# Patient Record
Sex: Male | Born: 2000 | Race: Black or African American | Hispanic: No | Marital: Single | Smoking: Never smoker
Health system: Southern US, Community
[De-identification: ages and names within clinical notes are randomized; demographics above are authoritative.]

---
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
## 2019-11-27 IMAGING — CR DG CHEST 2V
2 series · 2 of 2 positions shown · non-contrast
Comparison: None.

CLINICAL DATA: Epigastric pain.  Nausea and vomiting.

EXAM:
CHEST - 2 VIEW

[w chest pa]
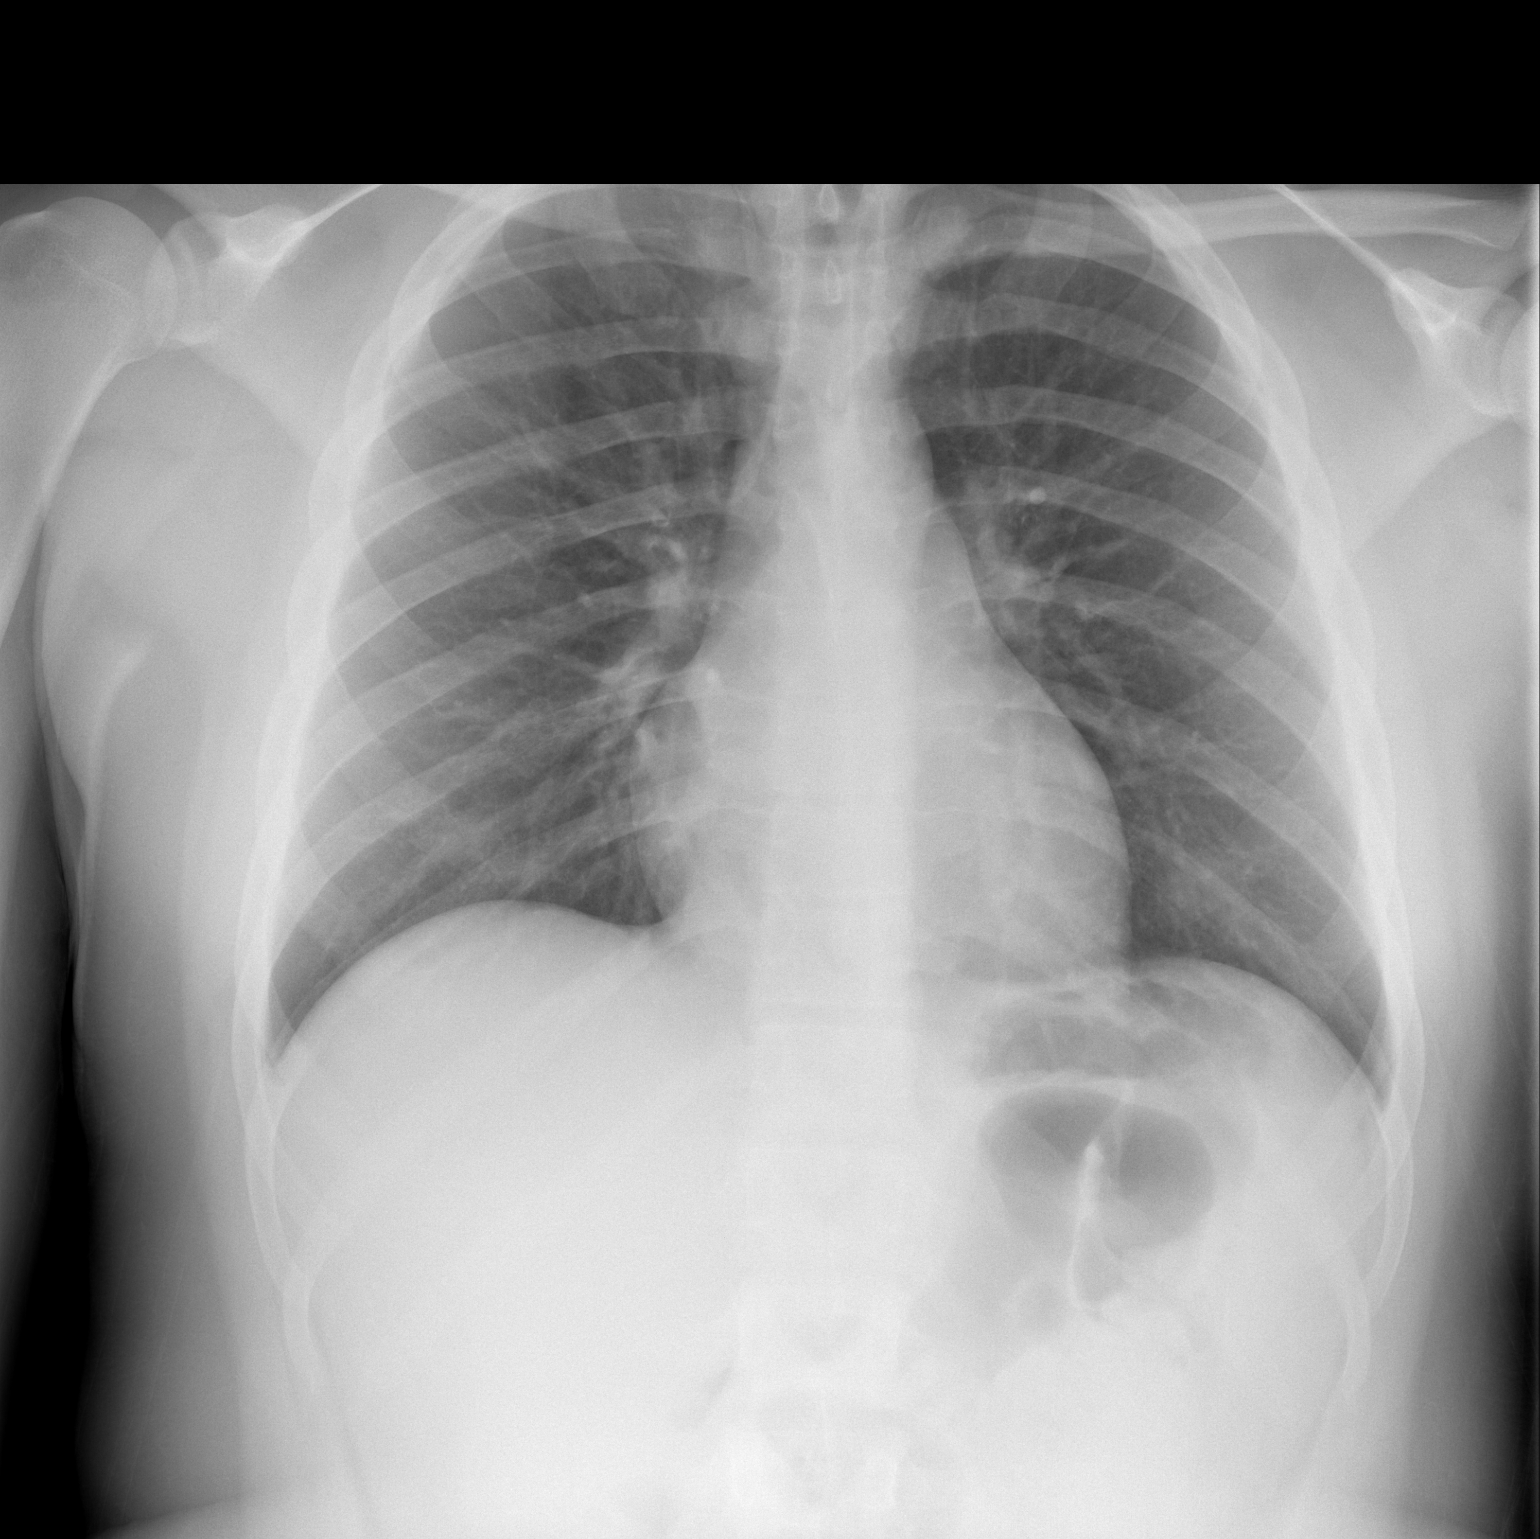

[w chest lat]
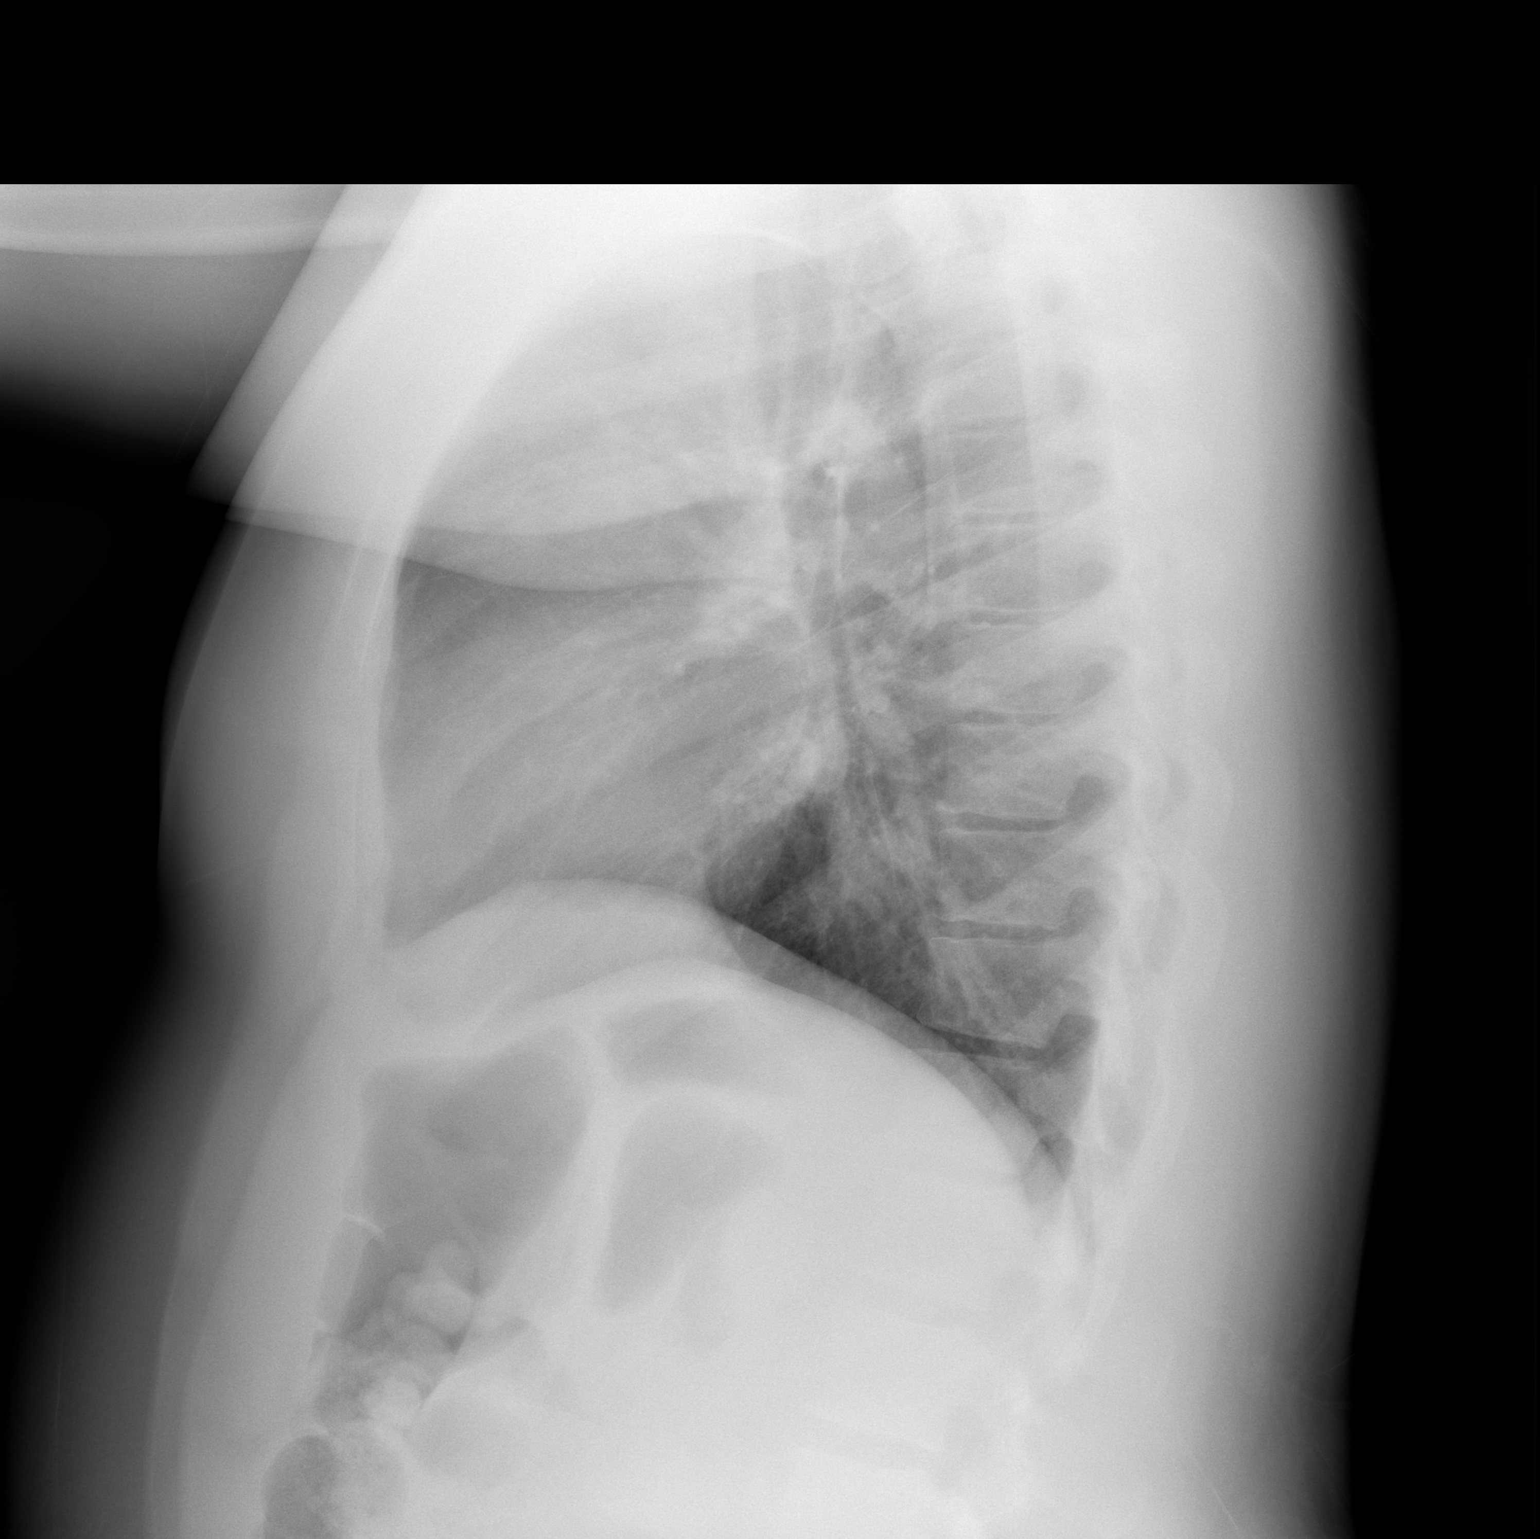

[2 of 2 positions shown; findings below may reference images not displayed]

FINDINGS: The heart size and mediastinal contours are within normal limits.
Both lungs are clear. The visualized skeletal structures are
unremarkable.
IMPRESSION: No active cardiopulmonary disease.

## 2019-11-27 IMAGING — CT CT ABD-PELV W/ CM
2 of 4 series · 16 of 46 positions shown, 18 images · IV contrast (APPLIED)
Comparison: None.

CLINICAL DATA: 16 y/o M; mid epigastric pain since this morning
with nausea, bowel movement this a.m., complains of constipation.

EXAM:
CT ABDOMEN AND PELVIS WITH CONTRAST
TECHNIQUE: Multidetector CT imaging of the abdomen and pelvis was performed
using the standard protocol following bolus administration of
intravenous contrast.
CONTRAST:  100mL AZ32OP-DXX IOPAMIDOL (AZ32OP-DXX) INJECTION 61%

[Series 2: axial st · axial · 0.86mm/px · z∈[+398,+842]mm · 13 of 97 slices shown, 15 images]
[im 4/97  soft-tissue]
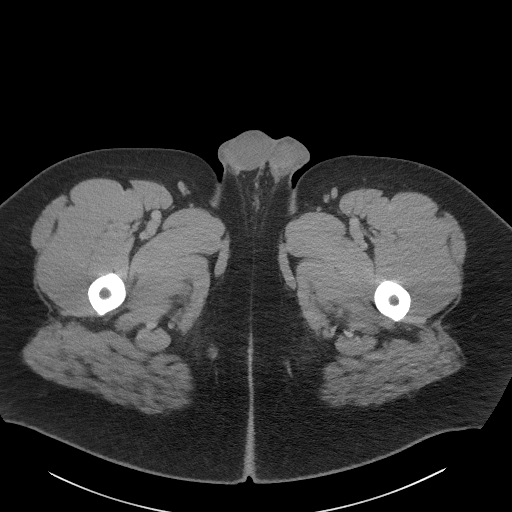
[im 4/97  bone]
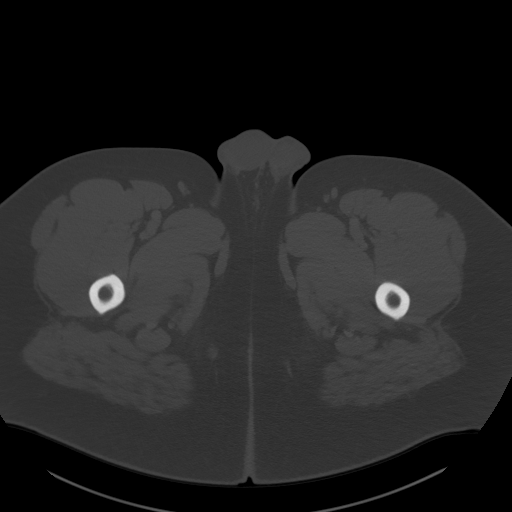
[im 12/97  soft-tissue]
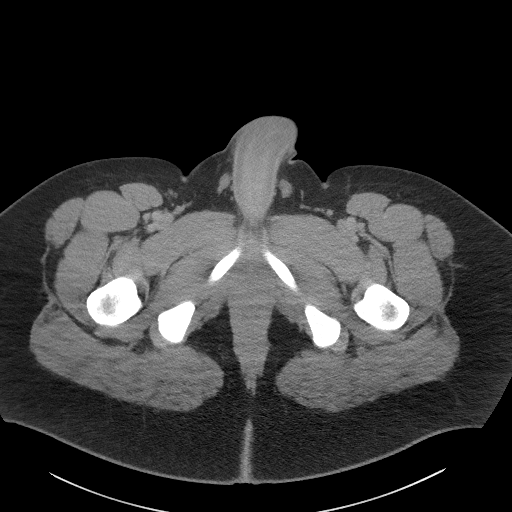
[im 20/97  soft-tissue]
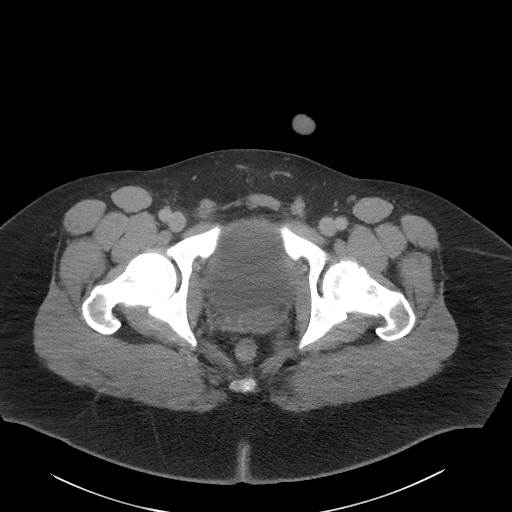
[im 27/97  soft-tissue]
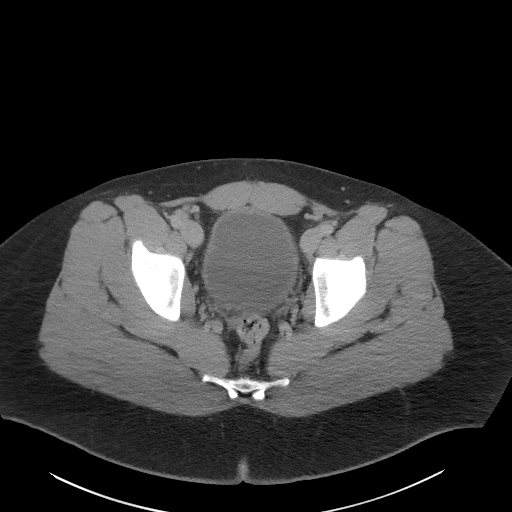
[im 35/97  soft-tissue]
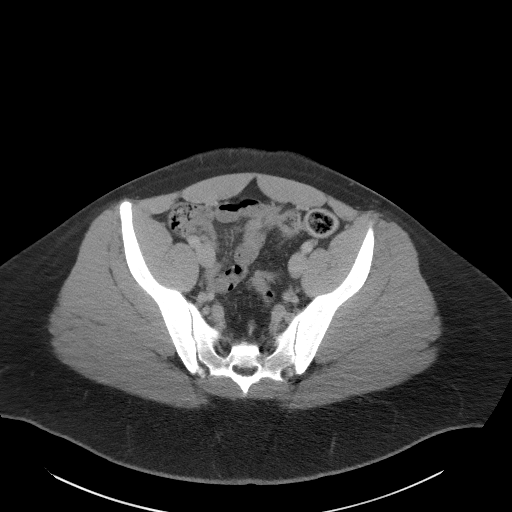
[im 43/97  soft-tissue]
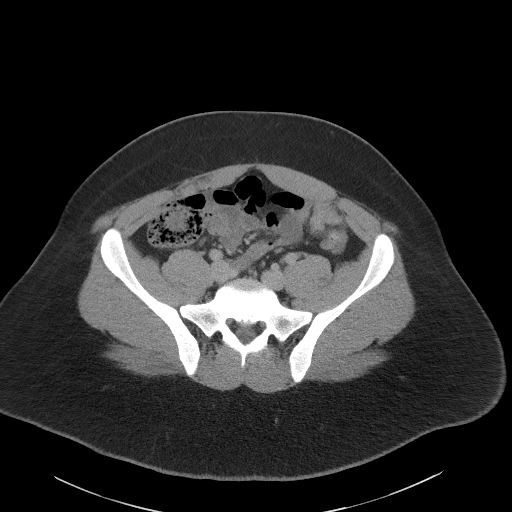
[im 50/97  soft-tissue]
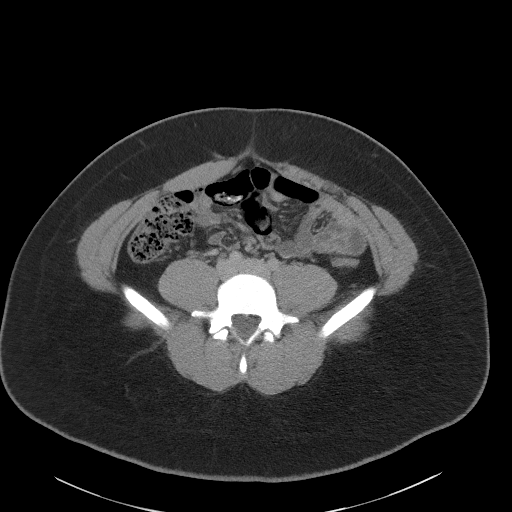
[im 54/97  soft-tissue]
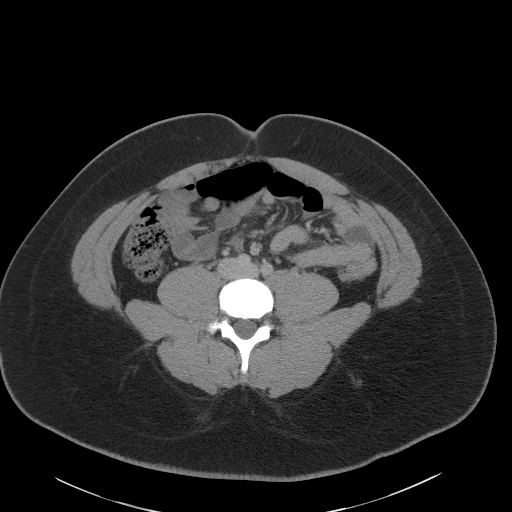
[im 62/97  soft-tissue]
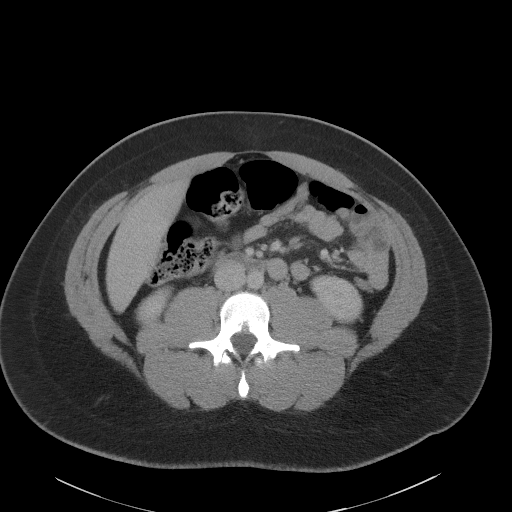
[im 62/97  bone]
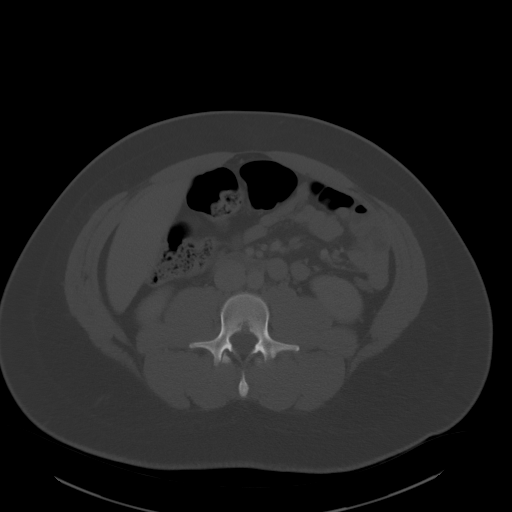
[im 70/97  soft-tissue]
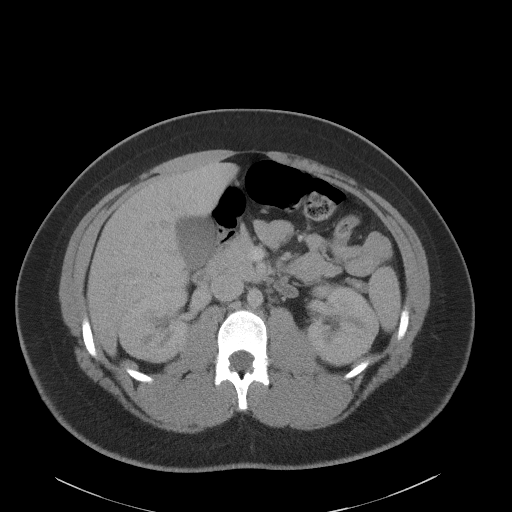
[im 77/97  soft-tissue]
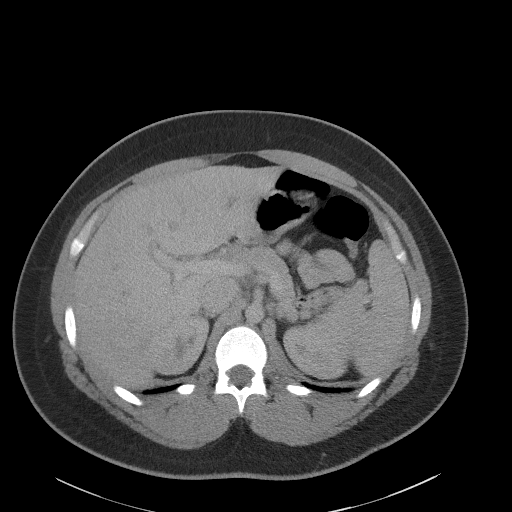
[im 85/97  soft-tissue]
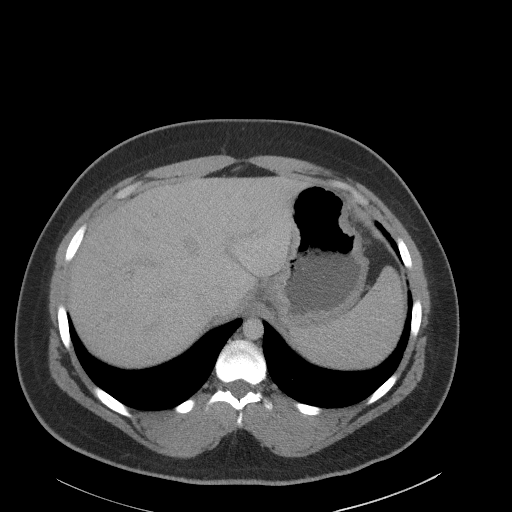
[im 93/97  soft-tissue]
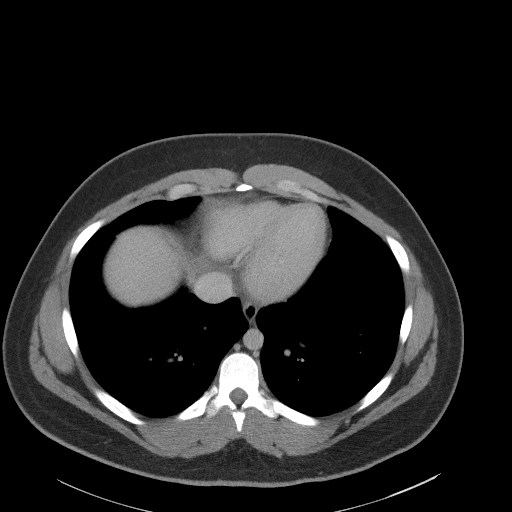

[Series 5: coronal st · coronal · 0.88mm/px · 3 of 99 slices shown]
[im 33/99  soft-tissue]
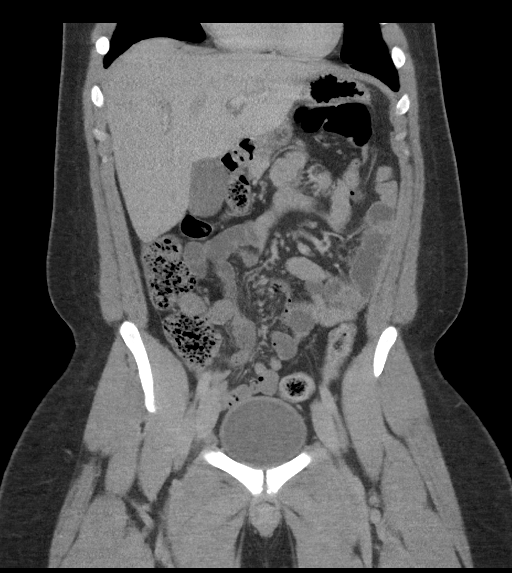
[im 44/99  soft-tissue]
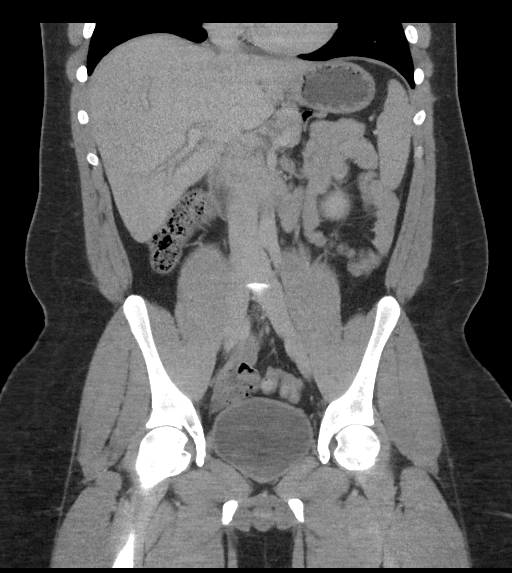
[im 55/99  soft-tissue]
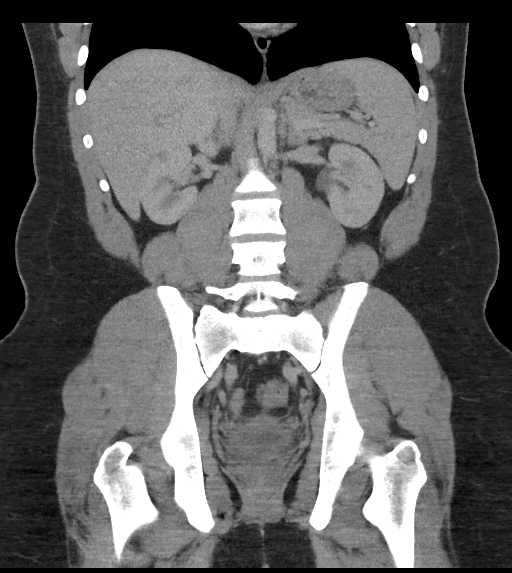

[16 of 46 positions shown; findings below may reference images not displayed]

FINDINGS: Lower chest: No acute abnormality.

Hepatobiliary: Circumferential periportal lucency, probably mild
periportal edema (for example series 5, image 46 and series 6, image
35). No focal liver lesion identified. No gallbladder wall
thickening or pericholecystic edema. No dilatation of the common
bile duct.

Pancreas: Unremarkable. No pancreatic ductal dilatation or
surrounding inflammatory changes.

Spleen: Normal in size without focal abnormality.

Adrenals/Urinary Tract: Normal adrenal glands. Subcentimeter right
kidney upper pole cyst. No other focal kidney lesion. No
hydronephrosis. No urinary stone disease. Normal bladder.

Stomach/Bowel: Stomach is within normal limits. Appendix appears
normal. No evidence of bowel wall thickening, distention, or
inflammatory changes.

Vascular/Lymphatic: No significant vascular findings are present. No
enlarged abdominal or pelvic lymph nodes.

Reproductive: Prostate is unremarkable.

Other: No abdominal wall hernia or abnormality. No abdominopelvic
ascites.

Musculoskeletal: No acute or significant osseous findings.
IMPRESSION: Mild periportal edema of uncertain significance, possibly related to
hepatitis, passive hepatic congestion, or hypervolemia. Biliary
dilatation or inflammation can mimic portal edema. Clinical
correlation and possible MRI/MRCP if indicated is recommended.

By: Ismu Moral M.D.
